# Patient Record
Sex: Male | Born: 1960 | Race: White | Hispanic: No | Marital: Married | Smoking: Never smoker
Health system: Southern US, Community
[De-identification: ages and names within clinical notes are randomized; demographics above are authoritative.]

## PROBLEM LIST (undated history)

## (undated) DIAGNOSIS — I219 Acute myocardial infarction, unspecified: Secondary | ICD-10-CM

## (undated) HISTORY — PX: CARDIAC SURGERY: SHX584

## (undated) HISTORY — PX: TONSILLECTOMY: SUR1361

---

## 2014-02-20 ENCOUNTER — Emergency Department (HOSPITAL_BASED_OUTPATIENT_CLINIC_OR_DEPARTMENT_OTHER)
Admission: EM | Admit: 2014-02-20 | Discharge: 2014-02-20 | Disposition: A | Discharge status: Home / Self Care | Payer: BC Managed Care – PPO | Attending: Emergency Medicine | Admitting: Emergency Medicine

## 2014-02-20 ENCOUNTER — Emergency Department (HOSPITAL_BASED_OUTPATIENT_CLINIC_OR_DEPARTMENT_OTHER): Payer: BC Managed Care – PPO

## 2014-02-20 ENCOUNTER — Encounter (HOSPITAL_BASED_OUTPATIENT_CLINIC_OR_DEPARTMENT_OTHER): Payer: Self-pay | Admitting: Emergency Medicine

## 2014-02-20 DIAGNOSIS — Z7982 Long term (current) use of aspirin: Secondary | ICD-10-CM | POA: Insufficient documentation

## 2014-02-20 DIAGNOSIS — Z79899 Other long term (current) drug therapy: Secondary | ICD-10-CM | POA: Insufficient documentation

## 2014-02-20 DIAGNOSIS — M24041 Loose body in right finger joint(s): Secondary | ICD-10-CM | POA: Insufficient documentation

## 2014-02-20 DIAGNOSIS — M79641 Pain in right hand: Secondary | ICD-10-CM | POA: Diagnosis present

## 2014-02-20 DIAGNOSIS — I252 Old myocardial infarction: Secondary | ICD-10-CM | POA: Insufficient documentation

## 2014-02-20 DIAGNOSIS — M2400 Loose body in unspecified joint: Secondary | ICD-10-CM

## 2014-02-20 HISTORY — DX: Acute myocardial infarction, unspecified: I21.9

## 2014-02-20 MED ORDER — DEXAMETHASONE SODIUM PHOSPHATE 4 MG/ML IJ SOLN
4.0000 mg | Freq: Once | INTRAMUSCULAR | Status: AC
  Administered 2014-02-20: 4 mg via INTRAVENOUS
  Filled 2014-02-20: qty 1

## 2014-02-20 MED ORDER — HYDROCODONE-ACETAMINOPHEN 5-325 MG PO TABS: 2.0000 | ORAL_TABLET | ORAL | Status: AC | PRN

## 2014-02-20 MED ORDER — BUPIVACAINE HCL 0.25 % IJ SOLN
20.0000 mL | Freq: Once | INTRAMUSCULAR | Status: AC
  Administered 2014-02-20: 1 mL
  Filled 2014-02-20: qty 1

## 2014-02-20 NOTE — ED Provider Notes (Signed)
CSN: [id number]     Arrival date & time 02/20/14  2113 History  This chart was scribed for Nelia Shiobert L Murlene Revell, MD by Bronson CurbJacqueline Melvin, ED Scribe. This patient was seen in room MH08/MH08 and the patient's care was started at 10:35 PM.    Chief Complaint  Patient presents with  . Hand Pain    The history is provided by the patient. No language interpreter was used.    HPI Comments: Jacob Rice is a 53 y.o. male who presents to the Emergency Department complaining of gradually worsening, constant pain to the right 5th finger that has been ongoing since yesterday. He states the pain radiates to his right hand. Patient denies any injury or trauma, and is unsure the cause of his pain. There is associated swelling. Patient has taken Benadryl without relief. 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 He states he no longer on Plavix (stopped 1 year ago), and does take Motrin on occasion. Patient denies history of DM.   Past Medical History  Diagnosis Date  . MI (myocardial infarction)    Past Surgical History  Procedure Laterality Date  . Tonsillectomy    . Cardiac surgery     No family history on file. History  Substance Use Topics  . Smoking status: Never Smoker   . Smokeless tobacco: Not on file  . Alcohol Use: Yes     Comment: Had 2 glasses of wine with dinner    Review of Systems  Musculoskeletal: Positive for myalgias (right 5th finger and right hand).  All other systems reviewed and are negative.     Allergies  Review of patient's allergies indicates no known allergies.  Home Medications   Prior to Admission medications   Medication Sig Start Date End Date Taking? Authorizing Provider  aspirin 325 MG tablet Take 325 mg by mouth daily.   Yes Historical Provider, MD  atorvastatin (LIPITOR) 40 MG tablet Take 40 mg by mouth daily.   Yes Historical Provider, MD  metoprolol succinate (TOPROL-XL) 25 MG 24 hr tablet Take 25 mg by mouth daily.   Yes  Historical Provider, MD  omega-3 acid ethyl esters (LOVAZA) 1 G capsule Take 2 g by mouth 2 (two) times daily.   Yes Historical Provider, MD  HYDROcodone-acetaminophen (NORCO/VICODIN) 5-325 MG per tablet Take 2 tablets by mouth every 4 (four) hours as needed. 02/20/14   Nelia Shiobert L Dazani Norby, MD   Triage Vitals: BP 153/93  Pulse 104  Temp(Src) 98.2 F (36.8 C) (Oral)  Resp 20  Ht 5\' 8"  (1.727 m)  Wt 205 lb (92.987 kg)  BMI 31.18 kg/m2  SpO2 98%  Physical Exam Physical Exam  Nursing note and vitals reviewed. Constitutional: He is oriented to person, place, and time. He appears well-developed and well-nourished. No distress.  HENT:  Head: Normocephalic and atraumatic.  Eyes: Pupils are equal, round, and reactive to light.  Neck: Normal range of motion.  Cardiovascular: Normal rate and intact distal pulses.   Pulmonary/Chest: No respiratory distress.  Abdominal: Normal appearance. He exhibits no distension.  Musculoskeletal: Normal range of motion.  Neurological: He is alert and oriented to person, place, and time. No cranial nerve deficit.  Skin: Skin is warm and dry. No rash noted.  Psychiatric: He has a normal mood and affect. His behavior is normal.   ED Course  Injection tendon or ligament Date/Time: 02/20/2014 9:35 PM Performed by: Nelia ShiBEATON, Cassell Voorhies L Authorized by: Nelia ShiBEATON, Kissa Campoy L Consent: Verbal consent obtained.  Risks and benefits: risks, benefits and alternatives were discussed Consent given by: patient Patient identity confirmed: verbally with patient and hospital-assigned identification number Time out: Immediately prior to procedure a "time out" was called to verify the correct patient, procedure, equipment, support staff and site/side marked as required. Preparation: Patient was prepped and draped in the usual sterile fashion. Local anesthesia used: no Patient sedated: no Patient tolerance: Patient tolerated the procedure well with no immediate complications.    (including critical care time)    DIAGNOSTIC STUDIES: Oxygen Saturation is 98% on room air, normal by my interpretation.    COORDINATION OF CARE: At 2238 Discussed treatment plan with patient which includes steroid injection and ibuprofen. Patient agrees.   Labs Review Labs Reviewed - No data to display  Imaging Review Dg Hand Complete Right  02/20/2014   CLINICAL DATA:  RIGHT fifth finger pain and swelling since yesterday, no known injury.  EXAM: RIGHT HAND - COMPLETE 3+ VIEW  COMPARISON:  None.  FINDINGS: No acute fracture deformity or dislocation. Fluffy subcentimeter calcifications about the fifth metatarsophalangeal joint space. Corticated punctate calcification at third metatarsal head may reflect remote injury. No destructive bony lesions. Soft tissue planes are nonsuspicious.  IMPRESSION: LEFT fifth MTP periarticular calcifications may reflect loose bodies.  No acute fracture deformity or dislocation.   Electronically Signed   By: Awilda Metroourtnay  Bloomer   On: 02/20/2014 21:49     EKG Interpretation None      MDM   Final diagnoses:  Joint loose bodies      I personally performed the services described in this documentation, which was scribed in my presence. The recorded information has been reviewed and considered.   Nelia Shiobert L Jacaria Colburn, MD 02/27/14 2137

## 2014-02-20 NOTE — ED Notes (Signed)
Pain to base of right 5th finger and side of hand since yesterday.  Pain increased today and is swollen now.  No known injury.

## 2014-02-20 NOTE — ED Notes (Signed)
C/o pain to rt little finger and hand radiating into wrist,  Denies inj  States did play golf 6 days last week

## 2015-12-31 IMAGING — CR DG HAND COMPLETE 3+V*R*
3 series · 3 of 3 positions shown · non-contrast
Comparison: None.

CLINICAL DATA: RIGHT fifth finger pain and swelling since
yesterday, no known injury.

EXAM:
RIGHT HAND - COMPLETE 3+ VIEW

[x hand pa right]
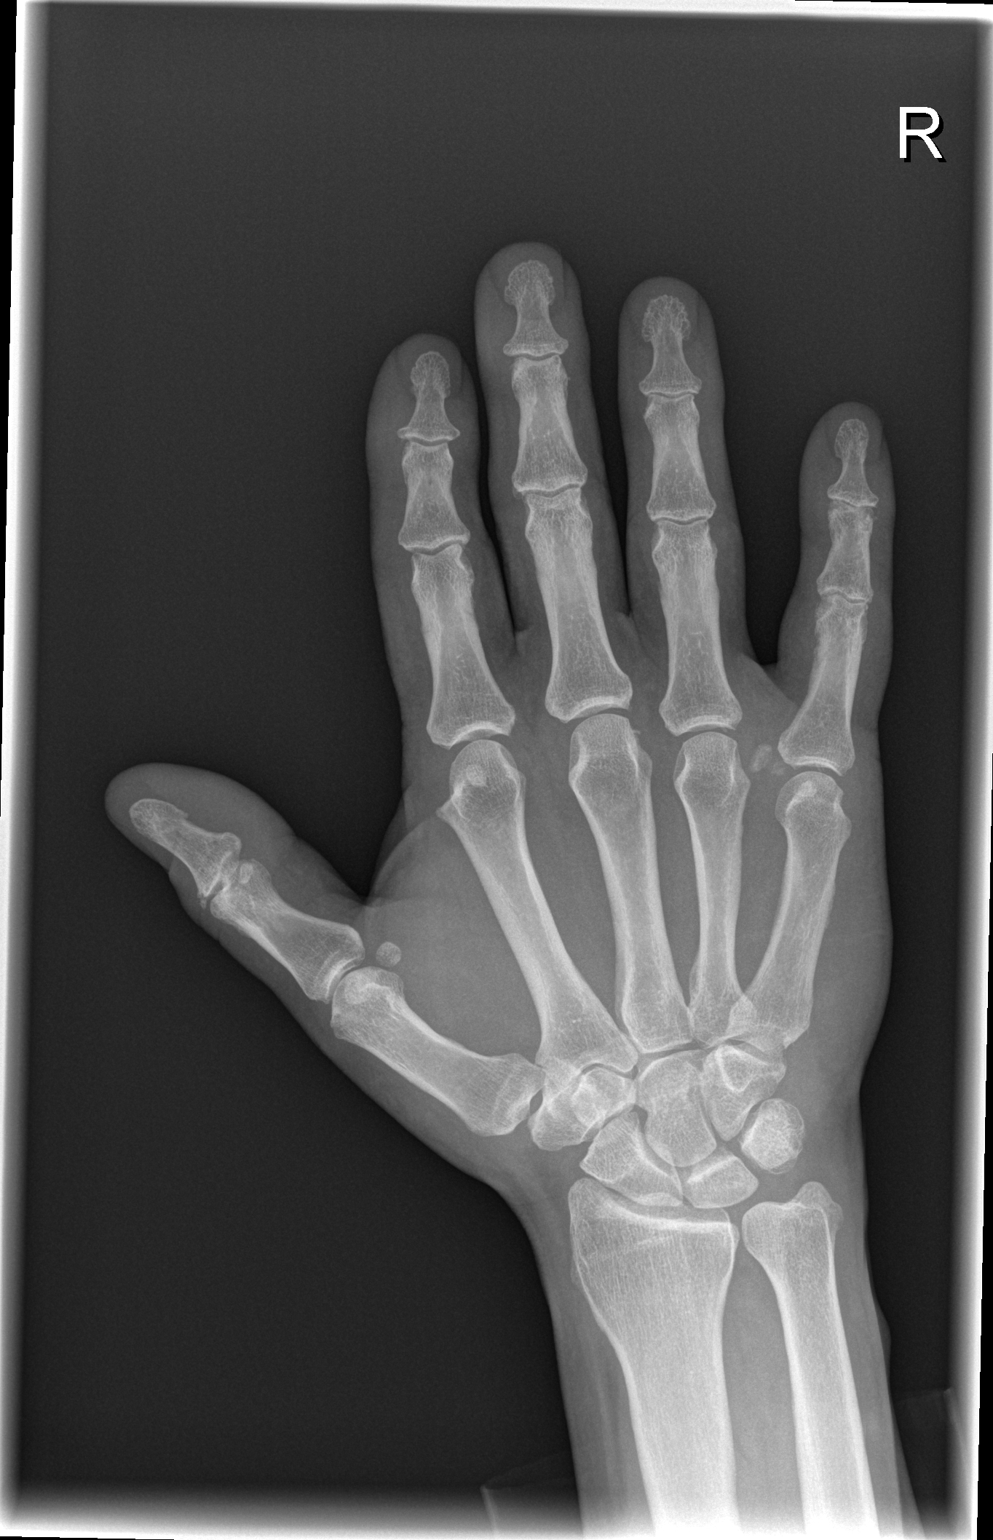

[x hand oblique right]
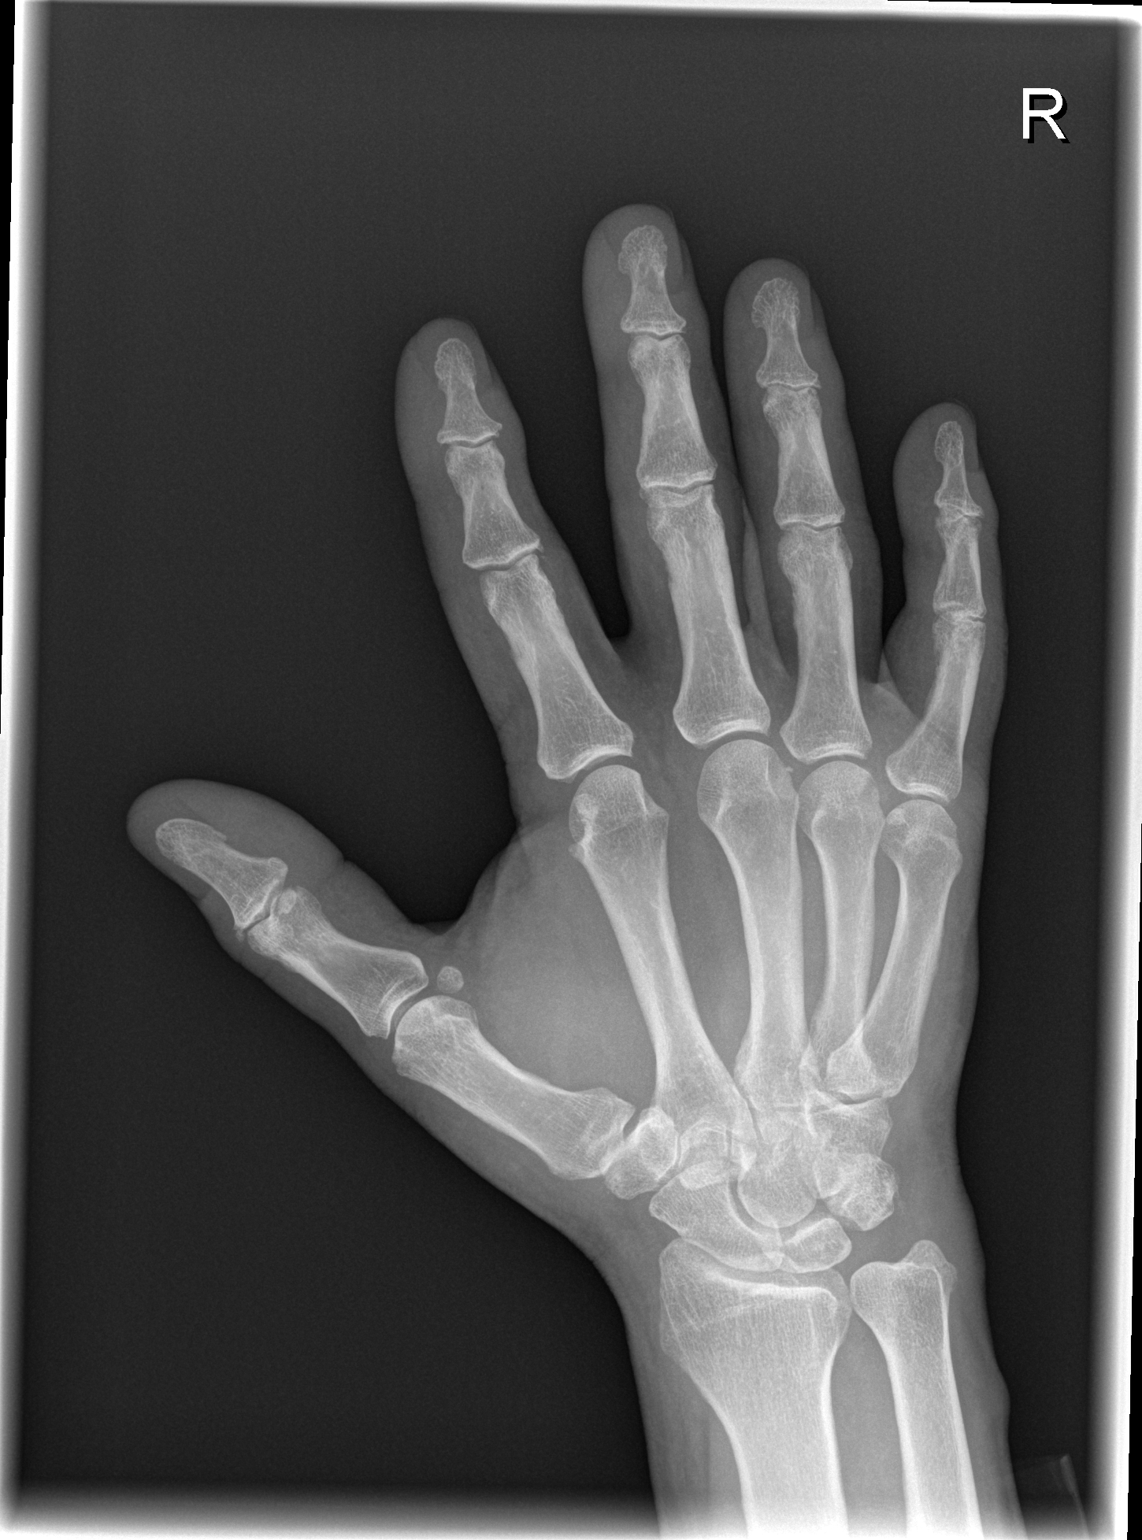

[x hand lat right]
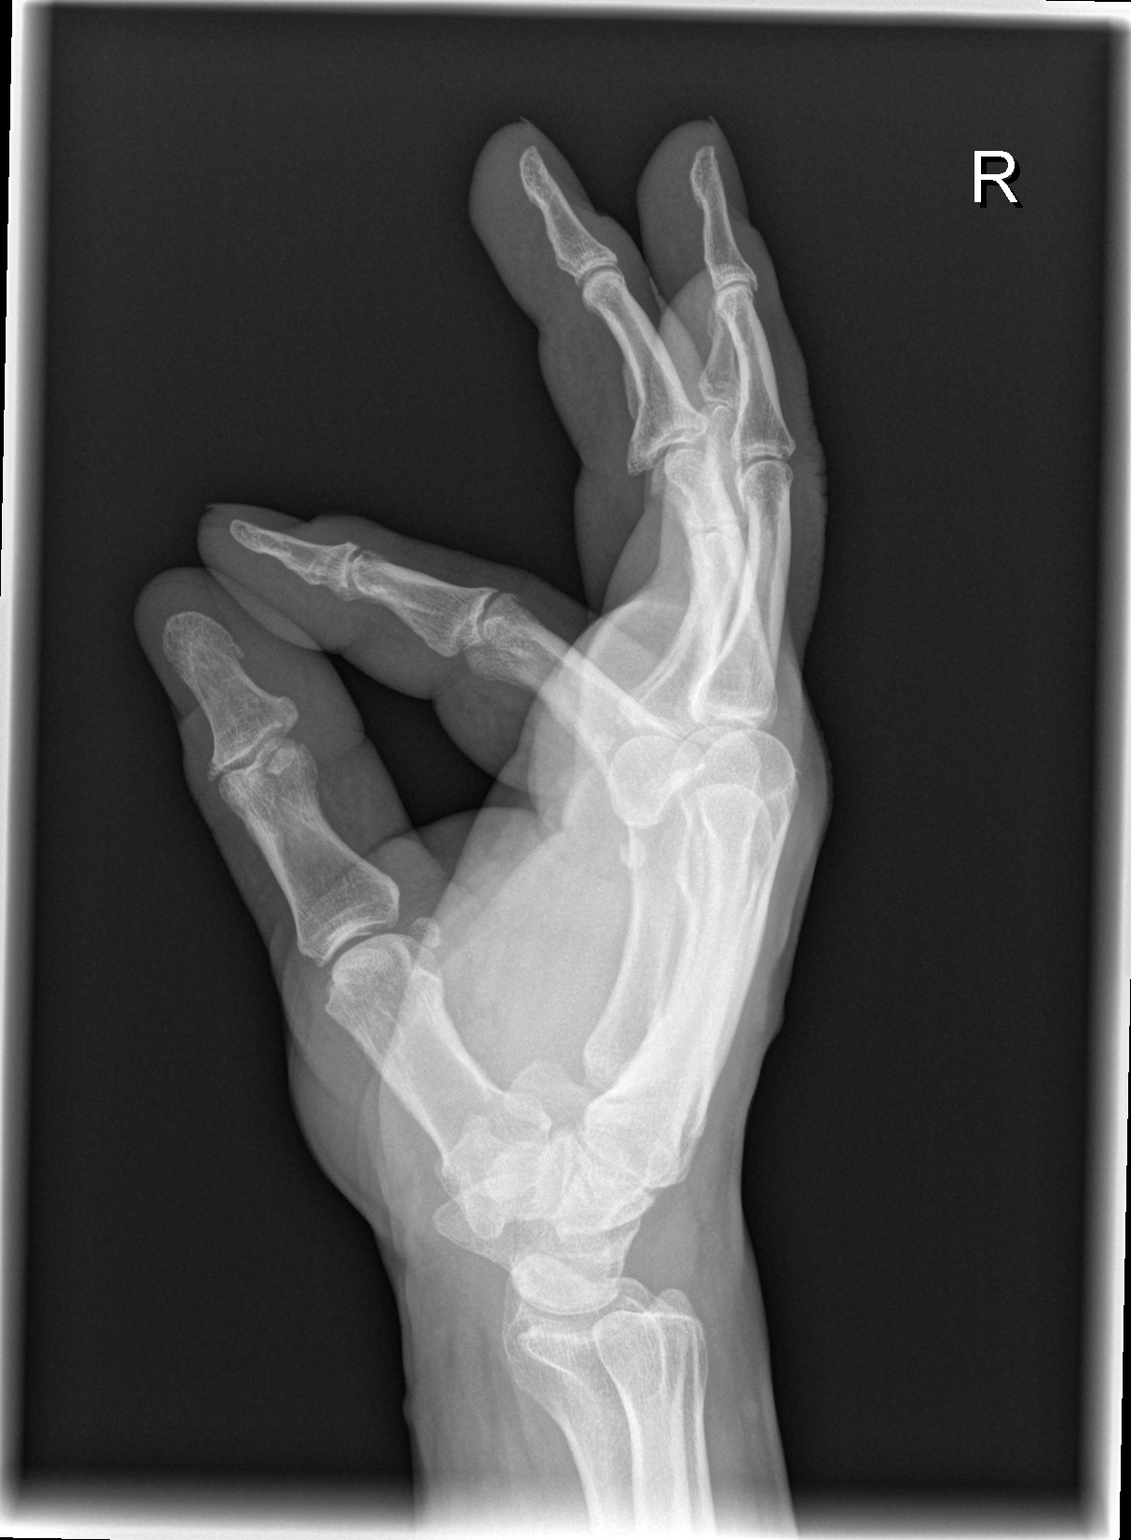

[3 of 3 positions shown; findings below may reference images not displayed]

FINDINGS: No acute fracture deformity or dislocation. Fluffy subcentimeter
calcifications about the fifth metatarsophalangeal joint space.
Corticated punctate calcification at third metatarsal head may
reflect remote injury. No destructive bony lesions. Soft tissue
planes are nonsuspicious.
IMPRESSION: LEFT fifth MTP periarticular calcifications may reflect loose
bodies.

No acute fracture deformity or dislocation.

  By: Jv Luengas
